# Patient Record
Sex: Male | Born: 2000 | Race: Black or African American | Hispanic: No | Smoking: Current every day smoker
Health system: Southern US, Community
[De-identification: ages and names within clinical notes are randomized; demographics above are authoritative.]

## PROBLEM LIST (undated history)

## (undated) DIAGNOSIS — F913 Oppositional defiant disorder: Secondary | ICD-10-CM

## (undated) DIAGNOSIS — F909 Attention-deficit hyperactivity disorder, unspecified type: Secondary | ICD-10-CM

## (undated) DIAGNOSIS — F419 Anxiety disorder, unspecified: Secondary | ICD-10-CM

## (undated) HISTORY — DX: Anxiety disorder, unspecified: F41.9

## (undated) HISTORY — DX: Oppositional defiant disorder: F91.3

## (undated) HISTORY — DX: Attention-deficit hyperactivity disorder, unspecified type: F90.9

---
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
## 2019-01-25 ENCOUNTER — Inpatient Hospital Stay (HOSPITAL_COMMUNITY): Payer: 59 | Admitting: Anesthesiology

## 2019-01-25 ENCOUNTER — Other Ambulatory Visit: Payer: Self-pay

## 2019-01-25 ENCOUNTER — Encounter (HOSPITAL_COMMUNITY): Admission: EM | Disposition: A | Discharge status: Home / Self Care | Payer: Self-pay | Source: Home / Self Care

## 2019-01-25 ENCOUNTER — Inpatient Hospital Stay (HOSPITAL_COMMUNITY)
Admission: EM | Admit: 2019-01-25 | Discharge: 2019-01-29 | DRG: 956 | Disposition: A | Discharge status: Home / Self Care | Payer: 59 | Attending: Student | Admitting: Student

## 2019-01-25 ENCOUNTER — Emergency Department (HOSPITAL_COMMUNITY): Payer: 59

## 2019-01-25 ENCOUNTER — Inpatient Hospital Stay (HOSPITAL_COMMUNITY): Payer: 59

## 2019-01-25 ENCOUNTER — Other Ambulatory Visit (HOSPITAL_COMMUNITY): Payer: Self-pay

## 2019-01-25 ENCOUNTER — Encounter (HOSPITAL_COMMUNITY): Payer: Self-pay | Admitting: *Deleted

## 2019-01-25 DIAGNOSIS — T148XXA Other injury of unspecified body region, initial encounter: Secondary | ICD-10-CM

## 2019-01-25 DIAGNOSIS — S42021A Displaced fracture of shaft of right clavicle, initial encounter for closed fracture: Secondary | ICD-10-CM | POA: Diagnosis present

## 2019-01-25 DIAGNOSIS — Z4682 Encounter for fitting and adjustment of non-vascular catheter: Secondary | ICD-10-CM

## 2019-01-25 DIAGNOSIS — J939 Pneumothorax, unspecified: Secondary | ICD-10-CM

## 2019-01-25 DIAGNOSIS — S82102A Unspecified fracture of upper end of left tibia, initial encounter for closed fracture: Secondary | ICD-10-CM | POA: Diagnosis present

## 2019-01-25 DIAGNOSIS — S27321A Contusion of lung, unilateral, initial encounter: Secondary | ICD-10-CM | POA: Diagnosis present

## 2019-01-25 DIAGNOSIS — S270XXA Traumatic pneumothorax, initial encounter: Secondary | ICD-10-CM | POA: Diagnosis present

## 2019-01-25 DIAGNOSIS — Z20828 Contact with and (suspected) exposure to other viral communicable diseases: Secondary | ICD-10-CM | POA: Diagnosis present

## 2019-01-25 DIAGNOSIS — S82192C Other fracture of upper end of left tibia, initial encounter for open fracture type IIIA, IIIB, or IIIC: Secondary | ICD-10-CM

## 2019-01-25 DIAGNOSIS — S2231XB Fracture of one rib, right side, initial encounter for open fracture: Secondary | ICD-10-CM

## 2019-01-25 DIAGNOSIS — R7309 Other abnormal glucose: Secondary | ICD-10-CM

## 2019-01-25 DIAGNOSIS — S7292XB Unspecified fracture of left femur, initial encounter for open fracture type I or II: Secondary | ICD-10-CM | POA: Diagnosis present

## 2019-01-25 DIAGNOSIS — D62 Acute posthemorrhagic anemia: Secondary | ICD-10-CM | POA: Diagnosis not present

## 2019-01-25 DIAGNOSIS — S2231XA Fracture of one rib, right side, initial encounter for closed fracture: Secondary | ICD-10-CM | POA: Diagnosis present

## 2019-01-25 DIAGNOSIS — Z419 Encounter for procedure for purposes other than remedying health state, unspecified: Secondary | ICD-10-CM

## 2019-01-25 DIAGNOSIS — S72302A Unspecified fracture of shaft of left femur, initial encounter for closed fracture: Secondary | ICD-10-CM | POA: Diagnosis present

## 2019-01-25 DIAGNOSIS — W3400XA Accidental discharge from unspecified firearms or gun, initial encounter: Secondary | ICD-10-CM

## 2019-01-25 DIAGNOSIS — S42021B Displaced fracture of shaft of right clavicle, initial encounter for open fracture: Secondary | ICD-10-CM

## 2019-01-25 DIAGNOSIS — Z9889 Other specified postprocedural states: Secondary | ICD-10-CM

## 2019-01-25 HISTORY — PX: TIBIA IM NAIL INSERTION: SHX2516

## 2019-01-25 HISTORY — PX: FEMUR IM NAIL: SHX1597

## 2019-01-25 HISTORY — DX: Accidental discharge from unspecified firearms or gun, initial encounter: W34.00XA

## 2019-01-25 HISTORY — PX: I & D EXTREMITY: SHX5045

## 2019-01-25 LAB — PREPARE RBC (CROSSMATCH)

## 2019-01-25 LAB — CBC
HCT: 37.3 % — ABNORMAL LOW (ref 39.0–52.0)
HCT: 13.3 % — ABNORMAL LOW (ref 39.0–52.0)
Hemoglobin: 12.1 g/dL — ABNORMAL LOW (ref 13.0–17.0)
Hemoglobin: 4.1 g/dL — CL (ref 13.0–17.0)
MCH: 31.8 pg (ref 26.0–34.0)
MCH: 30.8 pg (ref 26.0–34.0)
MCHC: 32.4 g/dL (ref 30.0–36.0)
MCHC: 30.8 g/dL (ref 30.0–36.0)
MCV: 98.2 fL (ref 80.0–100.0)
MCV: 100 fL (ref 80.0–100.0)
Platelets: 229 10*3/uL (ref 150–400)
Platelets: 52 10*3/uL — ABNORMAL LOW (ref 150–400)
RBC: 3.8 MIL/uL — ABNORMAL LOW (ref 4.22–5.81)
RBC: 1.33 MIL/uL — ABNORMAL LOW (ref 4.22–5.81)
RDW: 12.2 % (ref 11.5–15.5)
RDW: 13.5 % (ref 11.5–15.5)
WBC: 18.8 10*3/uL — ABNORMAL HIGH (ref 4.0–10.5)
WBC: 3.6 10*3/uL — ABNORMAL LOW (ref 4.0–10.5)
nRBC: 0 % (ref 0.0–0.2)
nRBC: 0 % (ref 0.0–0.2)

## 2019-01-25 LAB — COMPREHENSIVE METABOLIC PANEL
ALT: 18 U/L (ref 0–44)
AST: 38 U/L (ref 15–41)
Albumin: 3.8 g/dL (ref 3.5–5.0)
Alkaline Phosphatase: 37 U/L — ABNORMAL LOW (ref 38–126)
Anion gap: 12 (ref 5–15)
BUN: 16 mg/dL (ref 6–20)
CO2: 23 mmol/L (ref 22–32)
Calcium: 8.7 mg/dL — ABNORMAL LOW (ref 8.9–10.3)
Chloride: 104 mmol/L (ref 98–111)
Creatinine, Ser: 1.21 mg/dL (ref 0.61–1.24)
GFR calc Af Amer: >60 mL/min (ref >60)
GFR calc non Af Amer: >60 mL/min (ref >60)
Glucose, Bld: 218 mg/dL — ABNORMAL HIGH (ref 70–99)
Potassium: 4.6 mmol/L (ref 3.5–5.1)
Sodium: 139 mmol/L (ref 135–145)
Total Bilirubin: 0.4 mg/dL (ref 0.3–1.2)
Total Protein: 6.2 g/dL — ABNORMAL LOW (ref 6.5–8.1)

## 2019-01-25 LAB — SAMPLE TO BLOOD BANK

## 2019-01-25 LAB — SARS CORONAVIRUS 2 BY RT PCR (HOSPITAL ORDER, PERFORMED IN ~~LOC~~ HOSPITAL LAB): SARS Coronavirus 2: NEGATIVE

## 2019-01-25 LAB — PROTIME-INR
INR: 1.2 (ref 0.8–1.2)
Prothrombin Time: 15.1 seconds (ref 11.4–15.2)

## 2019-01-25 LAB — CDS SEROLOGY

## 2019-01-25 LAB — ABO/RH: ABO/RH(D): A NEG

## 2019-01-25 LAB — ETHANOL: Alcohol, Ethyl (B): <10 mg/dL (ref <10)

## 2019-01-25 SURGERY — IRRIGATION AND DEBRIDEMENT EXTREMITY
Anesthesia: General | Site: Leg Upper | Laterality: Right

## 2019-01-25 MED ORDER — MIDAZOLAM HCL 5 MG/5ML IJ SOLN
INTRAMUSCULAR | Status: DC | PRN
  Administered 2019-01-25: 2 mg via INTRAVENOUS

## 2019-01-25 MED ORDER — ACETAMINOPHEN 325 MG PO TABS: 650.0000 mg | ORAL_TABLET | ORAL | Status: DC | PRN

## 2019-01-25 MED ORDER — LACTATED RINGERS IV SOLN
INTRAVENOUS | Status: DC | PRN
  Administered 2019-01-25 (×2): via INTRAVENOUS

## 2019-01-25 MED ORDER — SODIUM CHLORIDE 0.9% IV SOLUTION: Freq: Once | INTRAVENOUS | Status: DC

## 2019-01-25 MED ORDER — ONDANSETRON 4 MG PO TBDP: 4.0000 mg | ORAL_TABLET | Freq: Four times a day (QID) | ORAL | Status: DC | PRN

## 2019-01-25 MED ORDER — SODIUM CHLORIDE 0.9 % IV SOLN
INTRAVENOUS | Status: DC
  Administered 2019-01-25 – 2019-01-26 (×4): via INTRAVENOUS

## 2019-01-25 MED ORDER — MIDAZOLAM HCL 2 MG/2ML IJ SOLN
INTRAMUSCULAR | Status: AC
  Filled 2019-01-25: qty 2

## 2019-01-25 MED ORDER — PHENYLEPHRINE HCL-NACL 10-0.9 MG/250ML-% IV SOLN
INTRAVENOUS | Status: DC | PRN
  Administered 2019-01-25 (×2): 50 ug/min via INTRAVENOUS

## 2019-01-25 MED ORDER — DEXAMETHASONE SODIUM PHOSPHATE 10 MG/ML IJ SOLN
INTRAMUSCULAR | Status: DC | PRN
  Administered 2019-01-25: 10 mg via INTRAVENOUS

## 2019-01-25 MED ORDER — ONDANSETRON HCL 4 MG/2ML IJ SOLN
INTRAMUSCULAR | Status: DC | PRN
  Administered 2019-01-25: 4 mg via INTRAVENOUS

## 2019-01-25 MED ORDER — OXYCODONE HCL 5 MG PO TABS: 5.0000 mg | ORAL_TABLET | Freq: Once | ORAL | Status: DC | PRN

## 2019-01-25 MED ORDER — PANTOPRAZOLE SODIUM 40 MG IV SOLR: 40.0000 mg | Freq: Every day | INTRAVENOUS | Status: DC

## 2019-01-25 MED ORDER — EPHEDRINE 5 MG/ML INJ
INTRAVENOUS | Status: AC
  Filled 2019-01-25: qty 10

## 2019-01-25 MED ORDER — ENOXAPARIN SODIUM 40 MG/0.4ML ~~LOC~~ SOLN: 40.0000 mg | SUBCUTANEOUS | Status: DC

## 2019-01-25 MED ORDER — FENTANYL CITRATE (PF) 100 MCG/2ML IJ SOLN
INTRAMUSCULAR | Status: AC
  Filled 2019-01-25: qty 2

## 2019-01-25 MED ORDER — DEXAMETHASONE SODIUM PHOSPHATE 10 MG/ML IJ SOLN
INTRAMUSCULAR | Status: AC
  Filled 2019-01-25: qty 1

## 2019-01-25 MED ORDER — PHENYLEPHRINE 40 MCG/ML (10ML) SYRINGE FOR IV PUSH (FOR BLOOD PRESSURE SUPPORT)
PREFILLED_SYRINGE | INTRAVENOUS | Status: DC | PRN
  Administered 2019-01-25 (×3): 120 ug via INTRAVENOUS
  Administered 2019-01-25 (×3): 80 ug via INTRAVENOUS
  Administered 2019-01-25: 120 ug via INTRAVENOUS
  Administered 2019-01-25: 80 ug via INTRAVENOUS

## 2019-01-25 MED ORDER — PROPOFOL 10 MG/ML IV BOLUS
INTRAVENOUS | Status: AC
  Filled 2019-01-25: qty 20

## 2019-01-25 MED ORDER — ACETAMINOPHEN 10 MG/ML IV SOLN: 1000.0000 mg | Freq: Once | INTRAVENOUS | Status: DC | PRN

## 2019-01-25 MED ORDER — TOBRAMYCIN SULFATE 1.2 G IJ SOLR
INTRAMUSCULAR | Status: AC
  Filled 2019-01-25: qty 1.2

## 2019-01-25 MED ORDER — 0.9 % SODIUM CHLORIDE (POUR BTL) OPTIME
TOPICAL | Status: DC | PRN
  Administered 2019-01-25 (×2): 1000 mL

## 2019-01-25 MED ORDER — CEFAZOLIN SODIUM-DEXTROSE 2-4 GM/100ML-% IV SOLN
2.0000 g | Freq: Once | INTRAVENOUS | Status: AC
  Administered 2019-01-25: 2 g via INTRAVENOUS

## 2019-01-25 MED ORDER — PHENYLEPHRINE 40 MCG/ML (10ML) SYRINGE FOR IV PUSH (FOR BLOOD PRESSURE SUPPORT)
PREFILLED_SYRINGE | INTRAVENOUS | Status: AC
  Filled 2019-01-25: qty 20

## 2019-01-25 MED ORDER — FENTANYL CITRATE (PF) 250 MCG/5ML IJ SOLN
INTRAMUSCULAR | Status: AC
  Filled 2019-01-25: qty 5

## 2019-01-25 MED ORDER — TETANUS-DIPHTH-ACELL PERTUSSIS 5-2.5-18.5 LF-MCG/0.5 IM SUSP
0.5000 mL | Freq: Once | INTRAMUSCULAR | Status: AC
  Administered 2019-01-25: 08:00:00 0.5 mL via INTRAMUSCULAR
  Filled 2019-01-25: qty 0.5

## 2019-01-25 MED ORDER — FENTANYL CITRATE (PF) 100 MCG/2ML IJ SOLN
INTRAMUSCULAR | Status: DC | PRN
  Administered 2019-01-25: 100 ug via INTRAVENOUS
  Administered 2019-01-25 (×3): 50 ug via INTRAVENOUS

## 2019-01-25 MED ORDER — CEFAZOLIN SODIUM-DEXTROSE 2-4 GM/100ML-% IV SOLN
2.0000 g | Freq: Three times a day (TID) | INTRAVENOUS | Status: AC
  Administered 2019-01-25 – 2019-01-26 (×3): 2 g via INTRAVENOUS
  Filled 2019-01-25 (×6): qty 100

## 2019-01-25 MED ORDER — MORPHINE SULFATE (PF) 2 MG/ML IV SOLN: 1.0000 mg | INTRAVENOUS | Status: DC | PRN

## 2019-01-25 MED ORDER — MORPHINE SULFATE (PF) 2 MG/ML IV SOLN
1.0000 mg | INTRAVENOUS | Status: DC | PRN
  Administered 2019-01-25: 2 mg via INTRAVENOUS
  Filled 2019-01-25: qty 1

## 2019-01-25 MED ORDER — ONDANSETRON HCL 4 MG/2ML IJ SOLN
INTRAMUSCULAR | Status: AC
  Filled 2019-01-25: qty 2

## 2019-01-25 MED ORDER — BACITRACIN ZINC 500 UNIT/GM EX OINT
TOPICAL_OINTMENT | CUTANEOUS | Status: AC
  Filled 2019-01-25: qty 28.35

## 2019-01-25 MED ORDER — ROCURONIUM BROMIDE 10 MG/ML (PF) SYRINGE
PREFILLED_SYRINGE | INTRAVENOUS | Status: AC
  Filled 2019-01-25: qty 10

## 2019-01-25 MED ORDER — ACETAMINOPHEN 325 MG PO TABS
650.0000 mg | ORAL_TABLET | Freq: Four times a day (QID) | ORAL | Status: DC
  Administered 2019-01-25 – 2019-01-29 (×14): 650 mg via ORAL
  Filled 2019-01-25 (×14): qty 2

## 2019-01-25 MED ORDER — PROPOFOL 10 MG/ML IV BOLUS
INTRAVENOUS | Status: DC | PRN
  Administered 2019-01-25: 150 mg via INTRAVENOUS

## 2019-01-25 MED ORDER — OXYCODONE HCL 5 MG/5ML PO SOLN: 5.0000 mg | Freq: Once | ORAL | Status: DC | PRN

## 2019-01-25 MED ORDER — KETOROLAC TROMETHAMINE 15 MG/ML IJ SOLN
15.0000 mg | Freq: Four times a day (QID) | INTRAMUSCULAR | Status: AC
  Administered 2019-01-25 – 2019-01-26 (×4): 15 mg via INTRAVENOUS
  Filled 2019-01-25 (×4): qty 1

## 2019-01-25 MED ORDER — OXYCODONE HCL 5 MG PO TABS
5.0000 mg | ORAL_TABLET | ORAL | Status: DC | PRN
  Administered 2019-01-27 – 2019-01-28 (×6): 10 mg via ORAL
  Administered 2019-01-29: 5 mg via ORAL
  Filled 2019-01-25 (×2): qty 1
  Filled 2019-01-25 (×5): qty 2
  Filled 2019-01-25: qty 1

## 2019-01-25 MED ORDER — EPHEDRINE SULFATE-NACL 50-0.9 MG/10ML-% IV SOSY
PREFILLED_SYRINGE | INTRAVENOUS | Status: DC | PRN
  Administered 2019-01-25: 10 mg via INTRAVENOUS
  Administered 2019-01-25: 15 mg via INTRAVENOUS

## 2019-01-25 MED ORDER — IOHEXOL 300 MG/ML  SOLN
100.0000 mL | Freq: Once | INTRAMUSCULAR | Status: AC | PRN
  Administered 2019-01-25: 100 mL via INTRAVENOUS

## 2019-01-25 MED ORDER — SUGAMMADEX SODIUM 200 MG/2ML IV SOLN
INTRAVENOUS | Status: DC | PRN
  Administered 2019-01-25: 200 mg via INTRAVENOUS

## 2019-01-25 MED ORDER — ROCURONIUM BROMIDE 50 MG/5ML IV SOSY
PREFILLED_SYRINGE | INTRAVENOUS | Status: DC | PRN
  Administered 2019-01-25: 20 mg via INTRAVENOUS
  Administered 2019-01-25: 60 mg via INTRAVENOUS

## 2019-01-25 MED ORDER — HYDROMORPHONE HCL 1 MG/ML IJ SOLN: 0.2500 mg | INTRAMUSCULAR | Status: DC | PRN

## 2019-01-25 MED ORDER — LIDOCAINE 2% (20 MG/ML) 5 ML SYRINGE
INTRAMUSCULAR | Status: AC
  Filled 2019-01-25: qty 5

## 2019-01-25 MED ORDER — VANCOMYCIN HCL 1000 MG IV SOLR
INTRAVENOUS | Status: AC
  Filled 2019-01-25: qty 1000

## 2019-01-25 MED ORDER — METOPROLOL TARTRATE 5 MG/5ML IV SOLN: 5.0000 mg | Freq: Four times a day (QID) | INTRAVENOUS | Status: DC | PRN

## 2019-01-25 MED ORDER — PANTOPRAZOLE SODIUM 40 MG PO TBEC
40.0000 mg | DELAYED_RELEASE_TABLET | Freq: Every day | ORAL | Status: DC
  Administered 2019-01-26 – 2019-01-27 (×2): 40 mg via ORAL
  Filled 2019-01-25 (×2): qty 1

## 2019-01-25 MED ORDER — ONDANSETRON HCL 4 MG/2ML IJ SOLN: 4.0000 mg | Freq: Four times a day (QID) | INTRAMUSCULAR | Status: DC | PRN

## 2019-01-25 MED ORDER — GABAPENTIN 100 MG PO CAPS
100.0000 mg | ORAL_CAPSULE | Freq: Three times a day (TID) | ORAL | Status: DC
  Administered 2019-01-25 – 2019-01-29 (×12): 100 mg via ORAL
  Filled 2019-01-25 (×12): qty 1

## 2019-01-25 MED ORDER — DOCUSATE SODIUM 100 MG PO CAPS
100.0000 mg | ORAL_CAPSULE | Freq: Two times a day (BID) | ORAL | Status: DC
  Administered 2019-01-26 – 2019-01-29 (×7): 100 mg via ORAL
  Filled 2019-01-25 (×8): qty 1

## 2019-01-25 MED ORDER — VANCOMYCIN HCL 1000 MG IV SOLR
INTRAVENOUS | Status: DC | PRN
  Administered 2019-01-25: 1000 mg

## 2019-01-25 MED ORDER — PROMETHAZINE HCL 25 MG/ML IJ SOLN: 6.2500 mg | INTRAMUSCULAR | Status: DC | PRN

## 2019-01-25 MED ORDER — ALBUMIN HUMAN 5 % IV SOLN
INTRAVENOUS | Status: DC | PRN
  Administered 2019-01-25 (×2): via INTRAVENOUS

## 2019-01-25 MED ORDER — LIDOCAINE 2% (20 MG/ML) 5 ML SYRINGE
INTRAMUSCULAR | Status: DC | PRN
  Administered 2019-01-25 (×2): 40 mg via INTRAVENOUS

## 2019-01-25 SURGICAL SUPPLY — 97 items
APL PRP STRL LF DISP 70% ISPRP (MISCELLANEOUS) ×3
BIT DRILL 3 FLUT 145X3.2XQC (BIT) IMPLANT
BIT DRILL CALIBRATED 4.2 (BIT) IMPLANT
BIT DRILL QC 3.2X145 (BIT) ×10
BIT DRILL SHORT 4.2 (BIT) IMPLANT
BIT DRILL STEP 4.5X6.5 (BIT) IMPLANT
BIT DRL 3 FLUT 145X3.2XQC (BIT) ×6
BLADE SURG 10 STRL SS (BLADE) ×10 IMPLANT
BNDG COHESIVE 4X5 TAN STRL (GAUZE/BANDAGES/DRESSINGS) ×5 IMPLANT
BNDG COHESIVE 6X5 TAN STRL LF (GAUZE/BANDAGES/DRESSINGS) IMPLANT
BNDG ELASTIC 4X5.8 VLCR STR LF (GAUZE/BANDAGES/DRESSINGS) ×5 IMPLANT
BNDG ELASTIC 6X5.8 VLCR STR LF (GAUZE/BANDAGES/DRESSINGS) ×5 IMPLANT
BNDG GAUZE ELAST 4 BULKY (GAUZE/BANDAGES/DRESSINGS) ×10 IMPLANT
BRUSH SCRUB EZ PLAIN DRY (MISCELLANEOUS) ×10 IMPLANT
CHLORAPREP W/TINT 26 (MISCELLANEOUS) ×5 IMPLANT
CONT SPEC 4OZ CLIKSEAL STRL BL (MISCELLANEOUS) ×2 IMPLANT
COVER MAYO STAND STRL (DRAPES) ×5 IMPLANT
COVER SURGICAL LIGHT HANDLE (MISCELLANEOUS) ×10 IMPLANT
COVER WAND RF STERILE (DRAPES) ×5 IMPLANT
DRAPE C-ARM 35X43 STRL (DRAPES) ×5 IMPLANT
DRAPE C-ARMOR (DRAPES) ×5 IMPLANT
DRAPE HALF SHEET 40X57 (DRAPES) ×10 IMPLANT
DRAPE IMP U-DRAPE 54X76 (DRAPES) ×10 IMPLANT
DRAPE INCISE IOBAN 66X45 STRL (DRAPES) ×5 IMPLANT
DRAPE ORTHO SPLIT 77X108 STRL (DRAPES) ×10
DRAPE SURG 17X23 STRL (DRAPES) ×5 IMPLANT
DRAPE SURG ORHT 6 SPLT 77X108 (DRAPES) ×6 IMPLANT
DRAPE U-SHAPE 47X51 STRL (DRAPES) ×5 IMPLANT
DRILL BIT CALIBRATED 4.2 (BIT) ×5
DRILL BIT SHORT 4.2 (BIT) ×10
DRILL BIT STEP 4.5X6.5 (BIT) ×5
DRSG ADAPTIC 3X8 NADH LF (GAUZE/BANDAGES/DRESSINGS) ×5 IMPLANT
DRSG MEPILEX BORDER 4X4 (GAUZE/BANDAGES/DRESSINGS) ×23 IMPLANT
DRSG MEPILEX BORDER 4X8 (GAUZE/BANDAGES/DRESSINGS) ×5 IMPLANT
ELECT CAUTERY BLADE 6.4 (BLADE) ×2 IMPLANT
ELECT REM PT RETURN 9FT ADLT (ELECTROSURGICAL) ×5
ELECTRODE REM PT RTRN 9FT ADLT (ELECTROSURGICAL) ×3 IMPLANT
EVACUATOR 1/8 PVC DRAIN (DRAIN) IMPLANT
GAUZE SPONGE 4X4 12PLY STRL (GAUZE/BANDAGES/DRESSINGS) ×3 IMPLANT
GAUZE SPONGE 4X4 12PLY STRL LF (GAUZE/BANDAGES/DRESSINGS) ×2 IMPLANT
GLOVE BIO SURGEON STRL SZ 6.5 (GLOVE) ×12 IMPLANT
GLOVE BIO SURGEON STRL SZ7.5 (GLOVE) ×24 IMPLANT
GLOVE BIO SURGEONS STRL SZ 6.5 (GLOVE) ×3
GLOVE BIOGEL PI IND STRL 6.5 (GLOVE) ×3 IMPLANT
GLOVE BIOGEL PI IND STRL 7.5 (GLOVE) ×3 IMPLANT
GLOVE BIOGEL PI INDICATOR 6.5 (GLOVE) ×4
GLOVE BIOGEL PI INDICATOR 7.5 (GLOVE) ×2
GOWN STRL REUS W/ TWL LRG LVL3 (GOWN DISPOSABLE) ×9 IMPLANT
GOWN STRL REUS W/ TWL XL LVL3 (GOWN DISPOSABLE) ×3 IMPLANT
GOWN STRL REUS W/TWL LRG LVL3 (GOWN DISPOSABLE) ×20
GOWN STRL REUS W/TWL XL LVL3 (GOWN DISPOSABLE) ×5
GUIDEWIRE 3.2X400 (WIRE) ×6 IMPLANT
HANDPIECE INTERPULSE COAX TIP (DISPOSABLE)
KIT BASIN OR (CUSTOM PROCEDURE TRAY) ×5 IMPLANT
KIT TURNOVER KIT B (KITS) ×5 IMPLANT
MANIFOLD NEPTUNE II (INSTRUMENTS) ×5 IMPLANT
NAIL 10 TI CANN FRN PF 380 LT (Nail) ×2 IMPLANT
NAIL TIB 8X330 (Nail) ×2 IMPLANT
NS IRRIG 1000ML POUR BTL (IV SOLUTION) ×5 IMPLANT
PACK GENERAL/GYN (CUSTOM PROCEDURE TRAY) ×5 IMPLANT
PACK ORTHO EXTREMITY (CUSTOM PROCEDURE TRAY) ×5 IMPLANT
PAD ARMBOARD 7.5X6 YLW CONV (MISCELLANEOUS) ×10 IMPLANT
PADDING CAST COTTON 6X4 STRL (CAST SUPPLIES) ×3 IMPLANT
RASP HELIOCORDIAL MED (MISCELLANEOUS) ×2 IMPLANT
REAMER ROD DEEP FLUTE 2.5X950 (INSTRUMENTS) ×2 IMPLANT
SCREW LOCK 4X70 TI (Screw) ×2 IMPLANT
SCREW LOCK STAR 5X42 (Screw) ×4 IMPLANT
SCREW LOCK STAR 5X44 (Screw) ×2 IMPLANT
SCREW LOCK STAR 5X66 (Screw) ×2 IMPLANT
SCREW LOCK T25 FT 32X4X3.3X (Screw) IMPLANT
SCREW LOCKING 4.0 34MM (Screw) ×2 IMPLANT
SCREW LOCKING 4X32 (Screw) ×5 IMPLANT
SCREW LOCKING 4X38 (Screw) ×2 IMPLANT
SCREW RECON 6.5X100MM (Screw) ×2 IMPLANT
SET HNDPC FAN SPRY TIP SCT (DISPOSABLE) IMPLANT
SPONGE LAP 18X18 RF (DISPOSABLE) ×5 IMPLANT
STAPLER VISISTAT 35W (STAPLE) ×5 IMPLANT
STOCKINETTE IMPERVIOUS LG (DRAPES) ×5 IMPLANT
SUT ETHILON 2 0 FS 18 (SUTURE) ×14 IMPLANT
SUT ETHILON 3 0 PS 1 (SUTURE) ×14 IMPLANT
SUT MNCRL AB 3-0 PS2 18 (SUTURE) ×5 IMPLANT
SUT MON AB 2-0 CT1 36 (SUTURE) ×7 IMPLANT
SUT PDS AB 0 CT 36 (SUTURE) IMPLANT
SUT VIC AB 0 CT1 27 (SUTURE)
SUT VIC AB 0 CT1 27XBRD ANBCTR (SUTURE) IMPLANT
SUT VIC AB 2-0 CT1 27 (SUTURE) ×10
SUT VIC AB 2-0 CT1 TAPERPNT 27 (SUTURE) ×6 IMPLANT
SWAB CULTURE ESWAB REG 1ML (MISCELLANEOUS) IMPLANT
TOWEL GREEN STERILE (TOWEL DISPOSABLE) ×10 IMPLANT
TOWEL GREEN STERILE FF (TOWEL DISPOSABLE) ×5 IMPLANT
TRAY CATH 16FR W/PLASTIC CATH (SET/KITS/TRAYS/PACK) ×2 IMPLANT
TUBE CONNECTING 12'X1/4 (SUCTIONS) ×1
TUBE CONNECTING 12X1/4 (SUCTIONS) ×4 IMPLANT
UNDERPAD 30X30 (UNDERPADS AND DIAPERS) ×5 IMPLANT
WATER STERILE IRR 1000ML POUR (IV SOLUTION) ×5 IMPLANT
WIPE CATHETER CARE KIT (MISCELLANEOUS) ×2 IMPLANT
YANKAUER SUCT BULB TIP NO VENT (SUCTIONS) ×5 IMPLANT

## 2019-01-25 NOTE — ED Notes (Signed)
Dr Ok Anis here
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
## 2019-01-25 NOTE — ED Notes (Signed)
IV FLUIDS WIDE OPEN

## 2019-01-25 NOTE — Progress Notes (Signed)
Radiology called, in need of pt's post op tib fib xray to be reordered. Order placed as wriitten before.

## 2019-01-25 NOTE — ED Notes (Signed)
bp 122/74
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
## 2019-01-25 NOTE — ED Notes (Signed)
HYPOTHERMIC FOUND LYING ON GROUND BY [facility]
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
## 2019-01-25 NOTE — ED Notes (Signed)
Pigt by dr Redmond Pulling   Then to chest tube suction sahara drainasge ail chest tube inserted rt chest   Suction sahara drainage system
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
## 2019-01-25 NOTE — ED Notes (Signed)
fentabyk 25 mg given iv prior to the chest tube insertion

## 2019-01-25 NOTE — ED Notes (Signed)
Temp still down  His rectal temp is muich lower than his temporal is  The bair hugger remains in place

## 2019-01-25 NOTE — Progress Notes (Signed)
CRITICAL VALUE ALERT  Critical Value:  HGB 4.1  Date & Time Notied:  01/25/2019  Provider Notified: Dr Lissa Hoard  Orders Received/Actions taken: blood orders received
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
## 2019-01-25 NOTE — Anesthesia Procedure Notes (Signed)
Procedure Name: Intubation Date/Time: 01/25/2019 12:25 PM Performed by: Trinna Post., CRNA Pre-anesthesia Checklist: Patient identified, Emergency Drugs available, Suction available, Patient being monitored and Timeout performed Patient Re-evaluated:Patient Re-evaluated prior to induction Oxygen Delivery Method: Circle system utilized Preoxygenation: Pre-oxygenation with 100% oxygen Induction Type: IV induction Ventilation: Mask ventilation without difficulty Laryngoscope Size: Mac and 4 Grade View: Grade I Tube type: Oral Tube size: 7.5 mm Number of attempts: 1 Airway Equipment and Method: Stylet Placement Confirmation: ETT inserted through vocal cords under direct vision,  positive ETCO2 and breath sounds checked- equal and bilateral Secured at: 23 cm Tube secured with: Tape Dental Injury: Teeth and Oropharynx as per pre-operative assessment
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
## 2019-01-25 NOTE — ED Notes (Signed)
To c-t and back  Therapist, music here to investigaTE  ON ARRIVAL  20 ANGIOCATH  LT FOREARM

## 2019-01-25 NOTE — ED Notes (Signed)
Temp temporal  95
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
## 2019-01-25 NOTE — ED Notes (Signed)
Portable xrays 

## 2019-01-25 NOTE — ED Notes (Signed)
DR Tobias Alexander
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
## 2019-01-25 NOTE — ED Notes (Signed)
LT KNEE MOBILIZER PLACED  BY [facility]
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
## 2019-01-25 NOTE — ED Notes (Signed)
T 05.9 TEMPORAL  WARM BLANKETS GIVEN FROM THE BEGINNING

## 2019-01-25 NOTE — Transfer of Care (Signed)
Immediate Anesthesia Transfer of Care Note  Patient: Morley Gaumer  Procedure(s) Performed: IRRIGATION AND DEBRIDEMENT EXTREMITY (Right Leg Lower) INTRAMEDULLARY (IM) NAIL FEMORAL (Left Leg Upper) Intramedullary (Im) Nail Tibial (Left Leg Lower)  Patient Location: PACU  Anesthesia Type:General  Level of Consciousness: awake and drowsy  Airway & Oxygen Therapy: Patient Spontanous Breathing and Patient connected to face mask oxygen  Post-op Assessment: Report given to RN and Post -op Vital signs reviewed and stable  Post vital signs: Reviewed and stable  Last Vitals:  Vitals Value Taken Time  BP 130/63 01/25/19 1627  Temp 36.6 C 01/25/19 1627  Pulse 99 01/25/19 1627  Resp 20 01/25/19 1627  SpO2      Last Pain:  Vitals:   01/25/19 1627  TempSrc: Oral  PainSc:          Complications: No apparent anesthesia complications   Critical Hgb of 4.1 called from lab, additional 2 units of blood arrived in PACU and nurses beginning rapid transfusion and will send a post-infusion CBC. Dr Lissa Hoard aware.

## 2019-01-25 NOTE — Anesthesia Postprocedure Evaluation (Signed)
Anesthesia Post Note  Patient: Dale Thomas  Procedure(s) Performed: IRRIGATION AND DEBRIDEMENT EXTREMITY (Right Leg Lower) INTRAMEDULLARY (IM) NAIL FEMORAL (Left Leg Upper) Intramedullary (Im) Nail Tibial (Left Leg Lower)     Patient location during evaluation: PACU Anesthesia Type: General Level of consciousness: sedated and patient cooperative Pain management: pain level controlled Vital Signs Assessment: post-procedure vital signs reviewed and stable Respiratory status: spontaneous breathing Cardiovascular status: stable Anesthetic complications: no Comments: Pt with hgb on CBC of 4.1. Hemodynamically stable. Giving 2 more units of prbcs.    Last Vitals:  Vitals:   01/25/19 1750 01/25/19 2000  BP: (!) 133/97 128/81  Pulse: 91 96  Resp: 20 19  Temp: (!) 36.3 C 37 C  SpO2: 100% 100%    Last Pain:  Vitals:   01/25/19 1956  TempSrc:   PainSc: 10-Worst pain ever                 Nolon Nations

## 2019-01-25 NOTE — ED Notes (Signed)
T. 93.9 RECTAL

## 2019-01-25 NOTE — ED Notes (Signed)
3rd l\iter of nss infused 4th liter hung  132ml /hr

## 2019-01-26 ENCOUNTER — Inpatient Hospital Stay (HOSPITAL_COMMUNITY): Payer: 59

## 2019-01-26 ENCOUNTER — Encounter (HOSPITAL_COMMUNITY): Payer: Self-pay | Admitting: General Practice

## 2019-01-26 LAB — TYPE AND SCREEN
ABO/RH(D): A NEG
Antibody Screen: NEGATIVE
Unit division: 0
Unit division: 0
Unit division: 0
Unit division: 0

## 2019-01-26 LAB — POCT I-STAT EG7
Acid-base deficit: 7 mmol/L — ABNORMAL HIGH (ref 0.0–2.0)
Acid-base deficit: 16 mmol/L — ABNORMAL HIGH (ref 0.0–2.0)
Bicarbonate: 20 mmol/L (ref 20.0–28.0)
Bicarbonate: 12.7 mmol/L — ABNORMAL LOW (ref 20.0–28.0)
Calcium, Ion: 1.06 mmol/L — ABNORMAL LOW (ref 1.15–1.40)
Calcium, Ion: 0.79 mmol/L — CL (ref 1.15–1.40)
HCT: 18 % — ABNORMAL LOW (ref 39.0–52.0)
HCT: 17 % — ABNORMAL LOW (ref 39.0–52.0)
Hemoglobin: 6.1 g/dL — CL (ref 13.0–17.0)
Hemoglobin: 5.8 g/dL — CL (ref 13.0–17.0)
O2 Saturation: 45 %
O2 Saturation: 52 %
Patient temperature: 35.4
Patient temperature: 36
Potassium: 5.4 mmol/L — ABNORMAL HIGH (ref 3.5–5.1)
Potassium: 4.9 mmol/L (ref 3.5–5.1)
Sodium: 141 mmol/L (ref 135–145)
Sodium: 146 mmol/L — ABNORMAL HIGH (ref 135–145)
TCO2: 22 mmol/L (ref 22–32)
TCO2: 14 mmol/L — ABNORMAL LOW (ref 22–32)
pCO2, Ven: 48.2 mmHg (ref 44.0–60.0)
pCO2, Ven: 39.9 mmHg — ABNORMAL LOW (ref 44.0–60.0)
pH, Ven: 7.217 — ABNORMAL LOW (ref 7.250–7.430)
pH, Ven: 7.106 — CL (ref 7.250–7.430)
pO2, Ven: 28 mmHg — CL (ref 32.0–45.0)
pO2, Ven: 35 mmHg (ref 32.0–45.0)

## 2019-01-26 LAB — BASIC METABOLIC PANEL
Anion gap: 10 (ref 5–15)
BUN: 8 mg/dL (ref 6–20)
CO2: 20 mmol/L — ABNORMAL LOW (ref 22–32)
Calcium: 7.8 mg/dL — ABNORMAL LOW (ref 8.9–10.3)
Chloride: 110 mmol/L (ref 98–111)
Creatinine, Ser: 0.89 mg/dL (ref 0.61–1.24)
GFR calc Af Amer: >60 mL/min (ref >60)
GFR calc non Af Amer: >60 mL/min (ref >60)
Glucose, Bld: 114 mg/dL — ABNORMAL HIGH (ref 70–99)
Potassium: 3.7 mmol/L (ref 3.5–5.1)
Sodium: 140 mmol/L (ref 135–145)

## 2019-01-26 LAB — BPAM RBC
Blood Product Expiration Date: 202012092359
Blood Product Expiration Date: 202012092359
Blood Product Expiration Date: 202012092359
Blood Product Expiration Date: 202012092359
ISSUE DATE / TIME: 202011161457
ISSUE DATE / TIME: 202011161457
ISSUE DATE / TIME: 202011161612
ISSUE DATE / TIME: 202011161612
Unit Type and Rh: 600
Unit Type and Rh: 600
Unit Type and Rh: 600
Unit Type and Rh: 600

## 2019-01-26 LAB — CBC
HCT: 27.8 % — ABNORMAL LOW (ref 39.0–52.0)
Hemoglobin: 10 g/dL — ABNORMAL LOW (ref 13.0–17.0)
MCH: 31.3 pg (ref 26.0–34.0)
MCHC: 36 g/dL (ref 30.0–36.0)
MCV: 86.9 fL (ref 80.0–100.0)
Platelets: 93 10*3/uL — ABNORMAL LOW (ref 150–400)
RBC: 3.2 MIL/uL — ABNORMAL LOW (ref 4.22–5.81)
RDW: 13.7 % (ref 11.5–15.5)
WBC: 6.9 10*3/uL (ref 4.0–10.5)
nRBC: 0 % (ref 0.0–0.2)

## 2019-01-26 LAB — HIV ANTIBODY (ROUTINE TESTING W REFLEX): HIV Screen 4th Generation wRfx: NONREACTIVE — AB
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
## 2019-01-27 ENCOUNTER — Inpatient Hospital Stay (HOSPITAL_COMMUNITY): Payer: 59

## 2019-01-27 ENCOUNTER — Encounter (HOSPITAL_COMMUNITY): Payer: Self-pay | Admitting: Student

## 2019-01-27 LAB — CBC
HCT: 24.3 % — ABNORMAL LOW (ref 39.0–52.0)
Hemoglobin: 8.8 g/dL — ABNORMAL LOW (ref 13.0–17.0)
MCH: 31.3 pg (ref 26.0–34.0)
MCHC: 36.2 g/dL — ABNORMAL HIGH (ref 30.0–36.0)
MCV: 86.5 fL (ref 80.0–100.0)
Platelets: 104 10*3/uL — ABNORMAL LOW (ref 150–400)
RBC: 2.81 MIL/uL — ABNORMAL LOW (ref 4.22–5.81)
RDW: 13.3 % (ref 11.5–15.5)
WBC: 6.8 10*3/uL (ref 4.0–10.5)
nRBC: 0 % (ref 0.0–0.2)

## 2019-01-27 LAB — VITAMIN D 25 HYDROXY (VIT D DEFICIENCY, FRACTURES): Vit D, 25-Hydroxy: 14 ng/mL — ABNORMAL LOW (ref 30–100)
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
## 2019-01-27 NOTE — Progress Notes (Signed)
Orthopedic Tech Progress Note Patient Details:  Dale Thomas Apr 09, 2000 [id number]  Ortho Devices Type of Ortho Device: Sling immobilizer Ortho Device/Splint Location: RUE Ortho Device/Splint Interventions: Application, Ordered, Adjustment   Post Interventions Patient Tolerated: Well Instructions Provided: Care of device, Adjustment of device   [name] 01/27/2019, 8:53 AM
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
## 2019-01-28 ENCOUNTER — Inpatient Hospital Stay (HOSPITAL_COMMUNITY): Payer: 59

## 2019-01-28 LAB — CBC
HCT: 22.6 % — ABNORMAL LOW (ref 39.0–52.0)
Hemoglobin: 7.8 g/dL — ABNORMAL LOW (ref 13.0–17.0)
MCH: 31 pg (ref 26.0–34.0)
MCHC: 34.5 g/dL (ref 30.0–36.0)
MCV: 89.7 fL (ref 80.0–100.0)
Platelets: 116 10*3/uL — ABNORMAL LOW (ref 150–400)
RBC: 2.52 MIL/uL — ABNORMAL LOW (ref 4.22–5.81)
RDW: 13.4 % (ref 11.5–15.5)
WBC: 7.2 10*3/uL (ref 4.0–10.5)
nRBC: 0 % (ref 0.0–0.2)

## 2019-01-28 LAB — BASIC METABOLIC PANEL
Anion gap: 8 (ref 5–15)
BUN: <5 mg/dL — ABNORMAL LOW (ref 6–20)
CO2: 27 mmol/L (ref 22–32)
Calcium: 8.2 mg/dL — ABNORMAL LOW (ref 8.9–10.3)
Chloride: 101 mmol/L (ref 98–111)
Creatinine, Ser: 0.74 mg/dL (ref 0.61–1.24)
GFR calc Af Amer: >60 mL/min (ref >60)
GFR calc non Af Amer: >60 mL/min (ref >60)
Glucose, Bld: 110 mg/dL — ABNORMAL HIGH (ref 70–99)
Potassium: 3.3 mmol/L — ABNORMAL LOW (ref 3.5–5.1)
Sodium: 136 mmol/L (ref 135–145)

## 2019-01-28 LAB — MAGNESIUM: Magnesium: 1.7 mg/dL (ref 1.7–2.4)

## 2019-01-28 LAB — PHOSPHORUS: Phosphorus: 3.1 mg/dL (ref 2.5–4.6)

## 2019-01-28 MED ORDER — POTASSIUM CHLORIDE CRYS ER 20 MEQ PO TBCR
40.0000 meq | EXTENDED_RELEASE_TABLET | Freq: Two times a day (BID) | ORAL | Status: AC
  Administered 2019-01-28 (×2): 40 meq via ORAL
  Filled 2019-01-28 (×2): qty 2
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
## 2019-01-28 NOTE — Progress Notes (Signed)
Inpatient Rehab Admissions:  Inpatient Rehab Consult received.  I met with pt at the bedside for rehabilitation assessment. We discussed program details and expectations. Pt stated he was not interested in CIR program as he would prefer to go home. Despite discussing benefits to program and differences between the acute hospital setting and inpatient rehab hospital, pt still declined.   I will sign off at this time.   [name], OTR/L  Rehab Admissions Coordinator  [phone] 01/28/2019 5:41 PM

## 2019-01-29 ENCOUNTER — Inpatient Hospital Stay (HOSPITAL_COMMUNITY): Payer: 59

## 2019-01-29 ENCOUNTER — Other Ambulatory Visit: Payer: Self-pay | Admitting: Physician Assistant

## 2019-01-29 LAB — CBC
HCT: 21.4 % — ABNORMAL LOW (ref 39.0–52.0)
Hemoglobin: 7.3 g/dL — ABNORMAL LOW (ref 13.0–17.0)
MCH: 30.8 pg (ref 26.0–34.0)
MCHC: 34.1 g/dL (ref 30.0–36.0)
MCV: 90.3 fL (ref 80.0–100.0)
Platelets: 168 10*3/uL (ref 150–400)
RBC: 2.37 MIL/uL — ABNORMAL LOW (ref 4.22–5.81)
RDW: 13.3 % (ref 11.5–15.5)
WBC: 5.6 10*3/uL (ref 4.0–10.5)
nRBC: 0 % (ref 0.0–0.2)

## 2019-01-29 LAB — MAGNESIUM: Magnesium: 1.8 mg/dL (ref 1.7–2.4)

## 2019-01-29 LAB — BASIC METABOLIC PANEL
Anion gap: 7 (ref 5–15)
BUN: <5 mg/dL — ABNORMAL LOW (ref 6–20)
CO2: 28 mmol/L (ref 22–32)
Calcium: 8.2 mg/dL — ABNORMAL LOW (ref 8.9–10.3)
Chloride: 103 mmol/L (ref 98–111)
Creatinine, Ser: 0.78 mg/dL (ref 0.61–1.24)
GFR calc Af Amer: >60 mL/min (ref >60)
GFR calc non Af Amer: >60 mL/min (ref >60)
Glucose, Bld: 126 mg/dL — ABNORMAL HIGH (ref 70–99)
Potassium: 3.5 mmol/L (ref 3.5–5.1)
Sodium: 138 mmol/L (ref 135–145)

## 2019-01-29 LAB — PHOSPHORUS: Phosphorus: 3.6 mg/dL (ref 2.5–4.6)

## 2019-01-29 MED ORDER — OXYCODONE HCL 5 MG PO TABS: 5.0000 mg | ORAL_TABLET | Freq: Four times a day (QID) | ORAL | 0 refills | Status: DC | PRN

## 2019-01-29 MED ORDER — FERROUS SULFATE 325 (65 FE) MG PO TABS
325.0000 mg | ORAL_TABLET | Freq: Three times a day (TID) | ORAL | Status: DC
  Administered 2019-01-29: 325 mg via ORAL
  Filled 2019-01-29: qty 1

## 2019-01-29 MED ORDER — POLYETHYLENE GLYCOL 3350 17 G PO PACK: 17.0000 g | PACK | Freq: Every day | ORAL | Status: DC | PRN

## 2019-01-29 MED ORDER — ACETAMINOPHEN 325 MG PO TABS: 650.0000 mg | ORAL_TABLET | Freq: Four times a day (QID) | ORAL | Status: DC | PRN

## 2019-01-29 MED ORDER — GABAPENTIN 100 MG PO CAPS: 100.0000 mg | ORAL_CAPSULE | Freq: Three times a day (TID) | ORAL | 0 refills | Status: AC

## 2019-01-29 MED ORDER — DOCUSATE SODIUM 100 MG PO CAPS: 100.0000 mg | ORAL_CAPSULE | Freq: Two times a day (BID) | ORAL | 0 refills | Status: DC | PRN

## 2019-01-29 MED ORDER — FERROUS SULFATE 325 (65 FE) MG PO TABS: 325.0000 mg | ORAL_TABLET | Freq: Three times a day (TID) | ORAL | 0 refills | Status: DC

## 2019-01-29 MED ORDER — VITAMIN C 500 MG PO TABS
500.0000 mg | ORAL_TABLET | Freq: Three times a day (TID) | ORAL | Status: DC
  Administered 2019-01-29 (×2): 500 mg via ORAL
  Filled 2019-01-29 (×2): qty 1

## 2019-01-29 MED ORDER — ASCORBIC ACID 500 MG PO TABS: 500.0000 mg | ORAL_TABLET | Freq: Three times a day (TID) | ORAL | 0 refills | Status: DC

## 2019-01-29 MED FILL — oxyCODONE HCL 5 MG TABS: 5 | 4 days supply | Qty: 15 | Fill #0

## 2019-01-29 MED FILL — VITAMIN C 500 MG TABLET: 500 | 7 days supply | Qty: 21 | Fill #0

## 2019-01-29 MED FILL — GABAPENTIN 100 MG CAPSULE: 100 | 20 days supply | Qty: 60 | Fill #0

## 2019-01-29 MED FILL — FERROUS SULFATE 325 MG TAB: 325 (65 FE) | 7 days supply | Qty: 21 | Fill #0
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
## 2019-01-29 NOTE — Discharge Instructions (Signed)
Weightbearing as tolerated to all extremities  PNEUMOTHORAX OR HEMOTHORAX +/- RIB FRACTURES  HOME INSTRUCTIONS   1. PAIN CONTROL:  1. Pain is best controlled by a usual combination of three different methods TOGETHER:  i. Ice/Heat ii. Over the counter pain medication iii. Prescription pain medication 2. You may experience some swelling and bruising in area of broken ribs. Ice packs or heating pads (30-60 minutes up to 6 times a day) will help. Use ice for the first few days to help decrease swelling and bruising, then switch to heat to help relax tight/sore spots and speed recovery. Some people prefer to use ice alone, heat alone, alternating between ice & heat. Experiment to what works for you. Swelling and bruising can take several weeks to resolve.  3. It is helpful to take an over-the-counter pain medication regularly for the first few weeks. Choose one of the following that works best for you:  i. Naproxen (Aleve, etc) Two 220mg  tabs twice a day ii. Ibuprofen (Advil, etc) Three 200mg  tabs four times a day (every meal & bedtime) iii. Acetaminophen (Tylenol, etc) 500-650mg  four times a day (every meal & bedtime) 4. A prescription for pain medication (such as oxycodone, hydrocodone, etc) may be given to you upon discharge. Take your pain medication as prescribed.  i. If you are having problems/concerns with the prescription medicine (does not control pain, nausea, vomiting, rash, itching, etc), please call us [phone] to see if we need to switch you to a different pain medicine that will work better for you and/or control your side effect better. ii. If you need a refill on your pain medication, please contact your pharmacy. They will contact our office to request authorization. Prescriptions will not be filled after 5 pm or on week-ends. 1. Avoid getting constipated. When taking pain medications, it is common to experience some constipation. Increasing fluid intake and taking a fiber  supplement (such as Metamucil, Citrucel, FiberCon, MiraLax, etc) 1-2 times a day regularly will usually help prevent this problem from occurring. A mild laxative (prune juice, Milk of Magnesia, MiraLax, etc) should be taken according to package directions if there are no bowel movements after 48 hours.  2. Watch out for diarrhea. If you have many loose bowel movements, simplify your diet to bland foods & liquids for a few days. Stop any stool softeners and decrease your fiber supplement. Switching to mild anti-diarrheal medications (Kayopectate, Pepto Bismol) can help. If this worsens or does not improve, please call us. 3. Chest tube site wound: you may remove the dressing from your chest tube site 3 days after the removal of your chest tube. DO NOT shower over the dressing. Once   removed, you may shower as normal. Do not submerge your wound in water for 2-3 weeks.  4. FOLLOW UP  a. Please call our office to set up or confirm an appointment for follow up for 2 weeks after discharge. You will need to get a chest xray at either [facility] or [facility]. This will be outlined in your follow up instructions. Please call [facility] at [phone] if you have any questions about follow up.  b. If you have any orthopedic or other injuries you will need to follow up as outlined in your follow up instructions.   WHEN TO CALL us [phone]:  1. Poor pain control 2. Reactions / problems with new medications (rash/itching, nausea, etc)  3. Fever over 101.5 F (38.5 C) 4. Worsening swelling or bruising 5.  Redness, drainage, pain or swelling around chest tube site 6. Worsening pain, productive cough, difficulty breathing or any other concerning symptoms  The clinic staff is available to answer your questions during regular business hours (8:30am-5pm). Please dont hesitate to call and ask to speak to one of our nurses for clinical concerns.  If you have a medical emergency, go to the nearest  emergency room or call 911.  [facility] from [facility] is always on call at the [facility]   [facility] [place]  [address], [address], [place], [place] [address] ?  MAIN: [phone] ? TOLL FREE: [phone] ?  FAX [phone]  [email or web address]      Information on Rib Fractures  A rib fracture is a break or crack in one of the bones of the ribs. The ribs are long, curved bones that wrap around your chest and attach to your spine and your breastbone. The ribs protect your heart, lungs, and other organs in the chest. A broken or cracked rib is often painful but is not usually serious. Most rib fractures heal on their own over time. However, rib fractures can be more serious if multiple ribs are broken or if broken ribs move out of place and push against other structures or organs. What are the causes? This condition is caused by:  Repetitive movements with high force, such as pitching a baseball or having severe coughing spells.  A direct blow to the chest, such as a sports injury, a car accident, or a fall.  Cancer that has spread to the bones, which can weaken bones and cause them to break. What are the signs or symptoms? Symptoms of this condition include:  Pain when you breathe in or cough.  Pain when someone presses on the injured area.  Feeling short of breath. How is this diagnosed? This condition is diagnosed with a physical exam and medical history. Imaging tests may also be done, such as:  Chest X-ray.  CT scan.  MRI.  Bone scan.  Chest ultrasound. How is this treated? Treatment for this condition depends on the severity of the fracture. Most rib fractures usually heal on their own in 1-3 months. Sometimes healing takes longer if there is a cough that does not stop or if there are other activities that make the injury worse (aggravating factors). While you heal, you will be given medicines to control the  pain. You will also be taught deep breathing exercises. Severe injuries may require hospitalization or surgery. Follow these instructions at home: Managing pain, stiffness, and swelling  If directed, apply ice to the injured area. ? Put ice in a plastic bag. ? Place a towel between your skin and the bag. ? Leave the ice on for 20 minutes, 2-3 times a day.  Take over-the-counter and prescription medicines only as told by your health care provider. Activity  Avoid a lot of activity and any activities or movements that cause pain. Be careful during activities and avoid bumping the injured rib.  Slowly increase your activity as told by your health care provider. General instructions  Do deep breathing exercises as told by your health care provider. This helps prevent pneumonia, which is a common complication of a broken rib. Your health care provider may instruct you to: ? Take deep breaths several times a day. ? Try to cough several times a day, holding a pillow against the injured area. ? Use a device called incentive spirometer to practice deep breathing  several times a day.  Drink enough fluid to keep your urine pale yellow.  Do not wear a rib belt or binder. These restrict breathing, which can lead to pneumonia.  Keep all follow-up visits as told by your health care provider. This is important. Contact a health care provider if:  You have a fever. Get help right away if:  You have difficulty breathing or you are short of breath.  You develop a cough that does not stop, or you cough up thick or bloody sputum.  You have nausea, vomiting, or pain in your abdomen.  Your pain gets worse and medicine does not help. Summary  A rib fracture is a break or crack in one of the bones of the ribs.  A broken or cracked rib is often painful but is not usually serious.  Most rib fractures heal on their own over time.  Treatment for this condition depends on the severity of the  fracture.  Avoid a lot of activity and any activities or movements that cause pain. This information is not intended to replace advice given to you by your health care provider. Make sure you discuss any questions you have with your health care provider. Document Released: 02/25/2005 Document Revised: 05/27/2016 Document Reviewed: 05/27/2016 Elsevier Interactive Patient Education  2019 [employer] Inc[employer]    Pneumothorax A pneumothorax is commonly called a collapsed lung. It is a condition in which air leaks from a lung and builds up between the thin layer of tissue that covers the lungs (visceral pleura) and the interior wall of the chest cavity (parietal pleura). The air gets trapped outside the lung, between the lung and the chest wall (pleural space). The air takes up space and prevents the lung from fully expanding. This condition sometimes occurs suddenly with no apparent cause. The buildup of air may be small or large. A small pneumothorax may go away on its own. A large pneumothorax will require treatment and hospitalization. What are the causes? This condition may be caused by:  Trauma and injury to the chest wall.  Surgery and other medical procedures.  A complication of an underlying lung problem, especially chronic obstructive pulmonary disease (COPD) or emphysema. Sometimes the cause of this condition is not known. What increases the risk? You are more likely to develop this condition if:  You have an underlying lung problem.  You smoke.  You are 47-5 years old, male, tall, and underweight.  You have a personal or family history of pneumothorax.  You have an eating disorder (anorexia nervosa). This condition can also happen quickly, even in people with no history of lung problems. What are the signs or symptoms? Sometimes a pneumothorax will have no symptoms. When symptoms are present, they can include:  Chest pain.  Shortness of breath.  Increased rate of  breathing.  Bluish color to your lips or skin (cyanosis). How is this diagnosed? This condition may be diagnosed by:  A medical history and physical exam.  A chest X-ray, chest CT scan, or ultrasound. How is this treated? Treatment depends on how severe your condition is. The goal of treatment is to remove the extra air and allow your lung to expand back to its normal size.  For a small pneumothorax: ? No treatment may be needed. ? Extra oxygen is sometimes used to make it go away more quickly.  For a large pneumothorax or a pneumothorax that is causing symptoms, a procedure is done to drain the air from your lungs. To  do this, a health care provider may use: ? A needle with a syringe. This is used to suck air from a pleural space where no additional leakage is taking place. ? A chest tube. This is used to suck air where there is ongoing leakage into the pleural space. The chest tube may need to remain in place for several days until the air leak has healed.  In more severe cases, surgery may be needed to repair the damage that is causing the leak.  If you have multiple pneumothorax episodes or have an air leak that will not heal, a procedure called a pleurodesis may be done. A medicine is placed in the pleural space to irritate the tissues around the lung so that the lung will stick to the chest wall, seal any leaks, and stop any buildup of air in that space. If you have an underlying lung problem, severe symptoms, or a large pneumothorax you will usually need to stay in the hospital. Follow these instructions at home: Lifestyle  Do not use any products that contain nicotine or tobacco, such as cigarettes and e-cigarettes. These are major risk factors in pneumothorax. If you need help quitting, ask your health care provider.  Do not lift anything that is heavier than 10 lb (4.5 kg), or the limit that your health care provider tells you, until he or she says that it is safe.  Avoid  activities that take a lot of effort (strenuous) for as long as told by your health care provider.  Return to your normal activities as told by your health care provider. Ask your health care provider what activities are safe for you.  Do not fly in an airplane or scuba dive until your health care provider says it is okay. General instructions  Take over-the-counter and prescription medicines only as told by your health care provider.  If a cough or pain makes it difficult for you to sleep at night, try sleeping in a semi-upright position in a recliner or by using 2 or 3 pillows.  If you had a chest tube and it was removed, ask your health care provider when you can remove the bandage (dressing). While the dressing is in place, do not allow it to get wet.  Keep all follow-up visits as told by your health care provider. This is important. Contact a health care provider if:  You cough up thick mucus (sputum) that is yellow or green in color.  You were treated with a chest tube, and you have redness, increasing pain, or discharge at the site where it was placed. Get help right away if:  You have increasing chest pain or shortness of breath.  You have a cough that will not go away.  You begin coughing up blood.  You have pain that is getting worse or is not controlled with medicines.  The site where your chest tube was located opens up.  You feel air coming out of the site where the chest tube was placed.  You have a fever or persistent symptoms for more than 2-3 days.  You have a fever and your symptoms suddenly get worse. These symptoms may represent a serious problem that is an emergency. Do not wait to see if the symptoms will go away. Get medical help right away. Call your local emergency services (911 in the [place] Do not drive yourself to the hospital. Summary  A pneumothorax, commonly called a collapsed lung, is a condition in which air leaks from  a lung and gets trapped  between the lung and the chest wall (pleural space).  The buildup of air may be small or large. A small pneumothorax may go away on its own. A large pneumothorax will require treatment and hospitalization.  Treatment for this condition depends on how severe the pneumothorax is. The goal of treatment is to remove the extra air and allow the lung to expand back to its normal size. This information is not intended to replace advice given to you by your health care provider. Make sure you discuss any questions you have with your health care provider. Document Released: 02/25/2005 Document Revised: 02/03/2017 Document Reviewed: 02/03/2017 Elsevier Interactive Patient Education  2019 [employer].

## 2019-01-29 NOTE — Progress Notes (Signed)
D/C home instructions given by 1st shift RN to patient. Meds and information packet with pt/family. Home equiptments  sent with pt. Discharged on stable condition via w/c.

## 2019-01-29 NOTE — Progress Notes (Signed)
PT Cancellation Note  Patient Details Name: Dale Thomas MRN: [id number] DOB: 07/26/2000   Cancelled Treatment:    Reason Eval/Treat Not Completed: Medical issues which prohibited therapy. RN was notified that Hgb and HCT were noted to be below cut-off values for physical therapy and trending down. RN asked PT to hold on treating pt until she spoke with the doctor. PT will continue to follow and treat as time and schedule allow.   [name], PT, DPT   Acute Rehabilitation Department [phone]   Otho Bellows 01/29/2019, 12:57 PM

## 2019-02-01 ENCOUNTER — Encounter (HOSPITAL_COMMUNITY): Payer: Self-pay | Admitting: Emergency Medicine

## 2019-02-01 ENCOUNTER — Encounter: Payer: Self-pay | Admitting: Student

## 2019-02-01 DIAGNOSIS — S42001A Fracture of unspecified part of right clavicle, initial encounter for closed fracture: Secondary | ICD-10-CM | POA: Insufficient documentation

## 2019-02-01 DIAGNOSIS — S270XXA Traumatic pneumothorax, initial encounter: Secondary | ICD-10-CM | POA: Insufficient documentation

## 2019-02-01 DIAGNOSIS — S7292XA Unspecified fracture of left femur, initial encounter for closed fracture: Secondary | ICD-10-CM | POA: Insufficient documentation

## 2019-02-01 DIAGNOSIS — S82202A Unspecified fracture of shaft of left tibia, initial encounter for closed fracture: Secondary | ICD-10-CM | POA: Insufficient documentation
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
## 2020-02-05 ENCOUNTER — Ambulatory Visit (HOSPITAL_COMMUNITY)
Admission: EM | Admit: 2020-02-05 | Discharge: 2020-02-05 | Disposition: A | Discharge status: Home / Self Care | Payer: 59

## 2020-02-05 ENCOUNTER — Encounter (HOSPITAL_COMMUNITY): Payer: Self-pay | Admitting: Emergency Medicine

## 2020-02-05 ENCOUNTER — Other Ambulatory Visit: Payer: Self-pay

## 2020-02-05 DIAGNOSIS — F4323 Adjustment disorder with mixed anxiety and depressed mood: Secondary | ICD-10-CM
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
## 2020-02-05 NOTE — Discharge Instructions (Addendum)
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
## 2020-02-12 ENCOUNTER — Telehealth (HOSPITAL_COMMUNITY): Payer: Self-pay

## 2020-02-12 NOTE — Telephone Encounter (Signed)
Care Management - Follow Up [facility] Discharges   Writer made contact with the patient.   Patient reports that he will be [facility].  Writer reminded the patient of the hours of the clinic.
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
## 2022-02-20 ENCOUNTER — Emergency Department (HOSPITAL_COMMUNITY): Payer: No Typology Code available for payment source

## 2022-02-20 ENCOUNTER — Emergency Department (HOSPITAL_COMMUNITY)
Admission: EM | Admit: 2022-02-20 | Discharge: 2022-02-20 | Disposition: A | Discharge status: Home / Self Care | Payer: No Typology Code available for payment source | Attending: Emergency Medicine | Admitting: Emergency Medicine

## 2022-02-20 DIAGNOSIS — M795 Residual foreign body in soft tissue: Secondary | ICD-10-CM | POA: Insufficient documentation

## 2022-02-20 DIAGNOSIS — S80851A Superficial foreign body, right lower leg, initial encounter: Secondary | ICD-10-CM

## 2022-02-20 DIAGNOSIS — M79604 Pain in right leg: Secondary | ICD-10-CM | POA: Diagnosis present

## 2022-02-20 MED ORDER — IBUPROFEN 600 MG PO TABS: 600.0000 mg | ORAL_TABLET | Freq: Four times a day (QID) | ORAL | 0 refills | Status: AC | PRN
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
## 2023-02-22 ENCOUNTER — Other Ambulatory Visit: Payer: Self-pay

## 2023-02-22 ENCOUNTER — Encounter (HOSPITAL_COMMUNITY): Payer: Self-pay

## 2023-02-22 ENCOUNTER — Emergency Department (HOSPITAL_COMMUNITY)
Admission: EM | Admit: 2023-02-22 | Discharge: 2023-02-22 | Disposition: A | Discharge status: Home / Self Care | Payer: No Typology Code available for payment source | Attending: Emergency Medicine | Admitting: Emergency Medicine

## 2023-02-22 DIAGNOSIS — F439 Reaction to severe stress, unspecified: Secondary | ICD-10-CM | POA: Diagnosis not present

## 2023-02-22 DIAGNOSIS — R55 Syncope and collapse: Secondary | ICD-10-CM

## 2023-02-22 LAB — COMPREHENSIVE METABOLIC PANEL
ALT: 17 U/L (ref 0–44)
AST: 21 U/L (ref 15–41)
Albumin: 4.7 g/dL (ref 3.5–5.0)
Alkaline Phosphatase: 38 U/L (ref 38–126)
Anion gap: 12 (ref 5–15)
BUN: 11 mg/dL (ref 6–20)
CO2: 26 mmol/L (ref 22–32)
Calcium: 9.9 mg/dL (ref 8.9–10.3)
Chloride: 103 mmol/L (ref 98–111)
Creatinine, Ser: 1.01 mg/dL (ref 0.61–1.24)
GFR, Estimated: >60 mL/min (ref >60)
Glucose, Bld: 96 mg/dL (ref 70–99)
Potassium: 4.1 mmol/L (ref 3.5–5.1)
Sodium: 141 mmol/L (ref 135–145)
Total Bilirubin: 1.3 mg/dL — ABNORMAL HIGH (ref <1.2)
Total Protein: 8.3 g/dL — ABNORMAL HIGH (ref 6.5–8.1)

## 2023-02-22 LAB — CBC WITH DIFFERENTIAL/PLATELET
Abs Immature Granulocytes: 0.01 10*3/uL (ref 0.00–0.07)
Basophils Absolute: 0 10*3/uL (ref 0.0–0.1)
Basophils Relative: 1 %
Eosinophils Absolute: 0.1 10*3/uL (ref 0.0–0.5)
Eosinophils Relative: 2 %
HCT: 45.5 % (ref 39.0–52.0)
Hemoglobin: 15.6 g/dL (ref 13.0–17.0)
Immature Granulocytes: 0 %
Lymphocytes Relative: 31 %
Lymphs Abs: 1.5 10*3/uL (ref 0.7–4.0)
MCH: 31.9 pg (ref 26.0–34.0)
MCHC: 34.3 g/dL (ref 30.0–36.0)
MCV: 93 fL (ref 80.0–100.0)
Monocytes Absolute: 0.3 10*3/uL (ref 0.1–1.0)
Monocytes Relative: 7 %
Neutro Abs: 2.8 10*3/uL (ref 1.7–7.7)
Neutrophils Relative %: 59 %
Platelets: 208 10*3/uL (ref 150–400)
RBC: 4.89 MIL/uL (ref 4.22–5.81)
RDW: 11.9 % (ref 11.5–15.5)
WBC: 4.8 10*3/uL (ref 4.0–10.5)
nRBC: 0 % (ref 0.0–0.2)

## 2023-02-22 LAB — URINALYSIS, ROUTINE W REFLEX MICROSCOPIC
Bacteria, UA: NONE SEEN
Bilirubin Urine: NEGATIVE
Glucose, UA: 50 mg/dL — AB
Hgb urine dipstick: NEGATIVE
Ketones, ur: NEGATIVE mg/dL
Leukocytes,Ua: NEGATIVE
Nitrite: NEGATIVE
Protein, ur: 30 mg/dL — AB
Specific Gravity, Urine: 1.028 (ref 1.005–1.030)
pH: 5 (ref 5.0–8.0)

## 2023-02-22 LAB — RAPID URINE DRUG SCREEN, HOSP PERFORMED
Amphetamines: NOT DETECTED
Barbiturates: NOT DETECTED
Benzodiazepines: NOT DETECTED
Cocaine: NOT DETECTED
Opiates: NOT DETECTED
Tetrahydrocannabinol: NOT DETECTED

## 2023-02-22 LAB — ETHANOL: Alcohol, Ethyl (B): <10 mg/dL (ref <10)

## 2023-02-22 NOTE — ED Triage Notes (Signed)
Patient reports he has been stressing over his gf being pregnant.  Reports he is not really eating and having nausea.  Patient reports he passed out the other day.  Patient will not make eye contact in triage. Denies abd pain.  Denies SI HI

## 2023-02-22 NOTE — Discharge Instructions (Signed)
You are seen in the emergency room today for near syncope.  Make sure you are staying well-hydrated with water alternating Pedialyte and Gatorade as needed.  Make sure you are eating regular meals 3 times a day.  I have attached behavioral health urgent care information please go for further evaluation.  I would also like for you to call primary care and schedule appointment for follow-up.  Return to emergency room with new or worsening symptoms.

## 2023-02-22 NOTE — ED Provider Notes (Signed)
[facility] AT [facility] Provider Note   CSN: [id number] Arrival date & time: 02/22/23  9629     History  Chief Complaint  Patient presents with   Stress    Dale Thomas is a 22 y.o. male patient with past medical history of oppositional defiant disorder, ADHD presenting to emergency room today after near syncopal event this morning.  Patient reports he was sitting in the garage stood up very quickly and then felt lightheaded.  This resolved on its own after a few seconds.  Patient reports he is feeling better now, however having increased stress.  Reports his girlfriend is pregnant unsure if the child is his which has made him feel very nervous anxious and stressed.  Patient reports this is associated with decreased sleep.  Patient's family member confirms that he has been eating less over the past 3 days but otherwise acting at his baseline.  Denies any abdominal pain, chest pain, shortness of breath, dizziness or current lightheadedness.  Denies SI, HI.  HPI     Home Medications Prior to Admission medications   Medication Sig Start Date End Date Taking? Authorizing Provider  gabapentin (NEURONTIN) 100 MG capsule Take 1 capsule (100 mg total) by mouth 3 (three) times daily. 01/29/19   Maczis, Elmer Sow, PA-C  ibuprofen (ADVIL) 600 MG tablet Take 1 tablet (600 mg total) by mouth every 6 (six) hours as needed. 02/20/22   Fayrene Helper, PA-C      Allergies    Adderall [amphetamine-dextroamphetamine] and Vyvanse [lisdexamfetamine dimesylate]    Review of Systems   Review of Systems  Neurological:  Positive for weakness.    Physical Exam Updated Vital Signs BP (!) 143/99 (BP Location: Right Arm)   Pulse 66   Temp 97.6 F (36.4 C)   Resp 18   Ht 5\' 7"  (1.702 m)   Wt 64 kg   SpO2 100%   BMI 22.08 kg/m  Physical Exam Vitals and nursing note reviewed.  Constitutional:      General: He is not in acute distress.    Appearance: He is not  toxic-appearing.  HENT:     Head: Normocephalic and atraumatic.  Eyes:     General: No scleral icterus.    Conjunctiva/sclera: Conjunctivae normal.  Cardiovascular:     Rate and Rhythm: Normal rate and regular rhythm.     Pulses: Normal pulses.     Heart sounds: Normal heart sounds.  Pulmonary:     Effort: Pulmonary effort is normal. No respiratory distress.     Breath sounds: Normal breath sounds.  Abdominal:     General: Abdomen is flat. Bowel sounds are normal. There is no distension.     Palpations: Abdomen is soft. There is no mass.     Tenderness: There is no abdominal tenderness.  Musculoskeletal:     Right lower leg: No edema.     Left lower leg: No edema.  Skin:    General: Skin is warm and dry.     Findings: No lesion.  Neurological:     General: No focal deficit present.     Mental Status: He is alert and oriented to person, place, and time. Mental status is at baseline.     Comments: Is alert and oriented answering questions appropriately no slurred speech.  Moving extremities without difficulty no focal neurological deficit on exam.  Psychiatric:     Comments: Appears upset     ED Results / Procedures / Treatments  Labs (all labs ordered are listed, but only abnormal results are displayed) Labs Reviewed  COMPREHENSIVE METABOLIC PANEL - Abnormal; Notable for the following components:      Result Value   Total Protein 8.3 (*)    Total Bilirubin 1.3 (*)    All other components within normal limits  URINALYSIS, ROUTINE W REFLEX MICROSCOPIC - Abnormal; Notable for the following components:   Color, Urine AMBER (*)    Glucose, UA 50 (*)    Protein, ur 30 (*)    All other components within normal limits  ETHANOL  RAPID URINE DRUG SCREEN, HOSP PERFORMED  CBC WITH DIFFERENTIAL/PLATELET    EKG EKG Interpretation Date/Time:  Saturday February 22 2023 09:59:22 EST Ventricular Rate:  78 PR Interval:  122 QRS Duration:  88 QT Interval:  330 QTC  Calculation: 376 R Axis:   86  Text Interpretation: Normal sinus rhythm Normal ECG No previous ECGs available Confirmed by Elayne Snare ([id number]) on 02/22/2023 12:12:06 PM  Radiology No results found.  Procedures Procedures    Medications Ordered in ED Medications - No data to display  ED Course/ Medical Decision Making/ A&P                                 Medical Decision Making Amount and/or Complexity of Data Reviewed Labs: ordered.   Dale Thomas 22 y.o. presented today for near syncope and stress. Working DDx that I considered at this time includes, but not limited to, CVA/TIA, arrhythmia, vertigo, medication s/e, orthostatic hypotension, electrolyte abnormalities, dehydration, URI, ACS, UTI, anemia   R/o DDx: These are considered less likely than current impression due to history of present illness, physical exam, lab/imaging findings   Review of prior external notes: None  Pmhx: Denies    Labs ordered urinalysis with small amount of glucose no hemoglobin, negative for nitrites or leukocytes.  CMP without obvious electrolyte abnormality.  No AKI and no anion gap.  EtOH less than 10, CBC without leukocytosis, no anemia.  Negative urine drug screen.  EKG is sinus  Problem List / ED Course / Critical interventions / Medication management  Patient reported to emergency room after near syncopal event.  Patient reports this happened after standing up very quickly from seated position.  Symptoms resolved quickly upon standing.  Patient reports decreased appetite, stress, nausea leading up to the event.  Patient has increased life stressors.  Has not felt down depressed or hopeless.  Denies SI, HI.  Patient does not want to speak with psychiatry team here.  Patient is alert oriented and in no acute distress.  Patient is here with mother and has good support system at home outside of increased stress from relationship with girlfriend.  Patient was given resources to go to  behavioral health urgent care and agrees to be seen there.  Stable for discharge. Medications were offered however patient declines. Patients vitals assessed. Upon arrival patient is hemodynamically stable.  I have reviewed the patients home medicines and have made adjustments as needed  Consult: None  Plan:  Go to [facility] Patient was given return precautions. Patient stable for discharge at this time.  Patient educated on current sx/dx and verbalized understanding of plan. Return to ER w/ new or worsening sx.          Final Clinical Impression(s) / ED Diagnoses Final diagnoses:  Near syncope  Stress    Rx / DC Orders ED Discharge Orders  None         Bobak Oguinn, Horald Chestnut, PA-C 02/22/23 1816    Rexford Maus, DO 02/23/23 [phone]

## 2023-02-22 NOTE — ED Notes (Signed)
Pt and family verbalized understanding of discharge instructions. Pt ambulated from ed with steady gait.
